# Patient Record
Sex: Female | Born: 1970 | Race: Black or African American | Hispanic: No | Smoking: Former smoker
Health system: Southern US, Community
[De-identification: ages and names within clinical notes are randomized; demographics above are authoritative.]

## PROBLEM LIST (undated history)

## (undated) DIAGNOSIS — C801 Malignant (primary) neoplasm, unspecified: Secondary | ICD-10-CM

## (undated) DIAGNOSIS — I10 Essential (primary) hypertension: Secondary | ICD-10-CM

## (undated) DIAGNOSIS — Z923 Personal history of irradiation: Secondary | ICD-10-CM

## (undated) HISTORY — PX: ABDOMINAL HYSTERECTOMY: SHX81

## (undated) HISTORY — PX: HERNIA REPAIR: SHX51

---

## 2005-09-11 ENCOUNTER — Emergency Department: Payer: Self-pay | Admitting: Emergency Medicine

## 2005-10-04 ENCOUNTER — Ambulatory Visit: Payer: Self-pay

## 2005-11-07 ENCOUNTER — Ambulatory Visit: Payer: Self-pay | Admitting: Oncology

## 2005-11-23 ENCOUNTER — Ambulatory Visit: Payer: Self-pay | Admitting: Oncology

## 2005-11-29 ENCOUNTER — Ambulatory Visit: Payer: Self-pay | Admitting: General Surgery

## 2006-01-08 ENCOUNTER — Ambulatory Visit: Payer: Self-pay | Admitting: General Surgery

## 2006-03-14 ENCOUNTER — Ambulatory Visit: Payer: Self-pay | Admitting: Oncology

## 2006-03-24 ENCOUNTER — Ambulatory Visit: Payer: Self-pay | Admitting: Oncology

## 2006-04-23 ENCOUNTER — Ambulatory Visit: Payer: Self-pay | Admitting: Oncology

## 2006-04-27 ENCOUNTER — Emergency Department: Payer: Self-pay | Admitting: Emergency Medicine

## 2006-04-27 ENCOUNTER — Other Ambulatory Visit: Payer: Self-pay

## 2006-05-24 ENCOUNTER — Ambulatory Visit: Payer: Self-pay | Admitting: Oncology

## 2006-07-03 ENCOUNTER — Ambulatory Visit: Payer: Self-pay | Admitting: Radiation Oncology

## 2007-01-03 ENCOUNTER — Ambulatory Visit: Payer: Self-pay | Admitting: Oncology

## 2007-07-22 IMAGING — US US PELV - US TRANSVAGINAL
1 series · 17 of 25 positions shown · non-contrast
Comparison: none

REASON FOR EXAM: Dysmenorrhea
COMMENTS:

[Series 1: us pelv - us transvaginal · 17 of 49 slices shown]
[im 1/49]
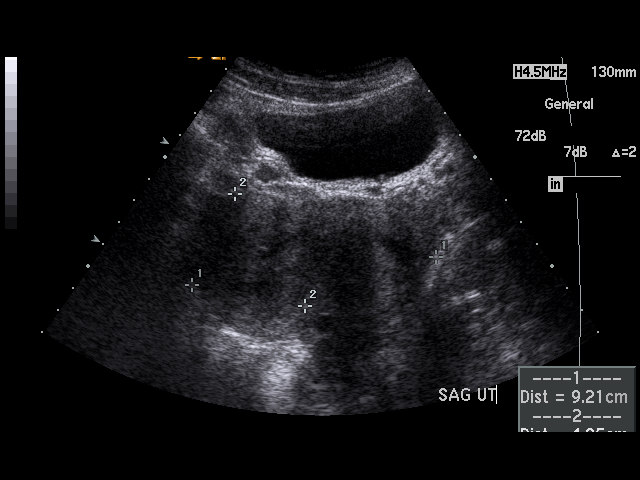
[im 5/49]
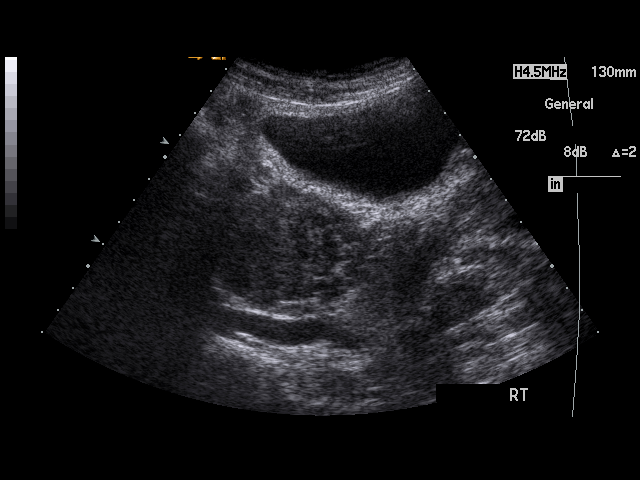
[im 7/49]
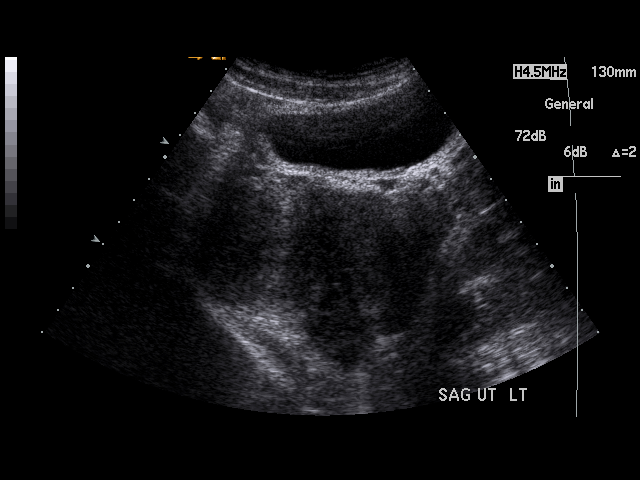
[im 11/49]
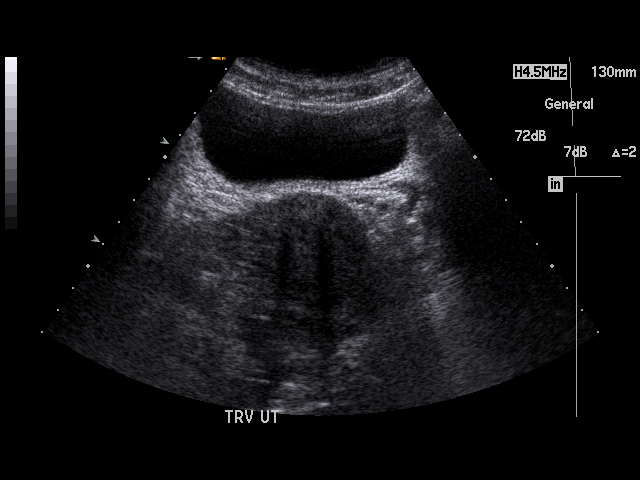
[im 13/49]
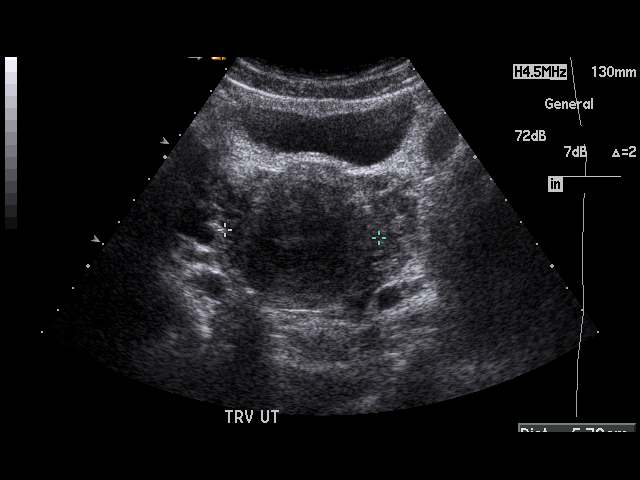
[im 17/49]
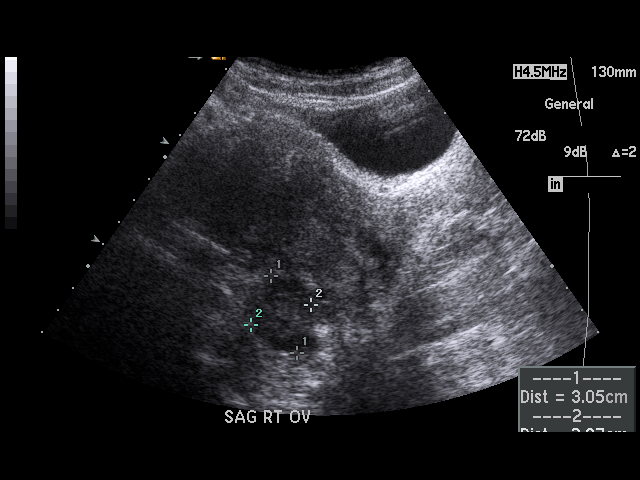
[im 19/49]
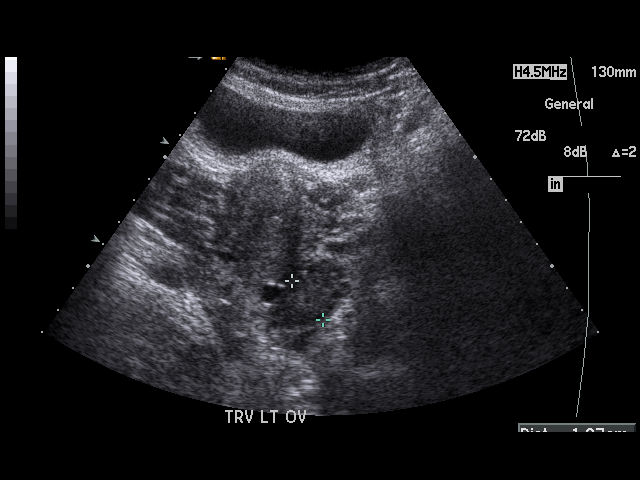
[im 23/49]
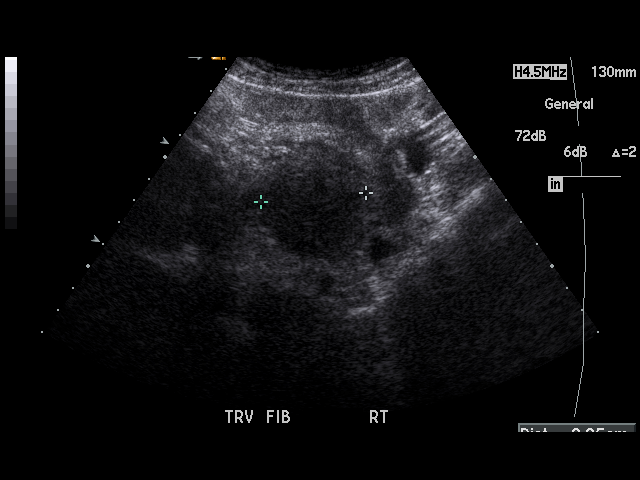
[im 25/49]
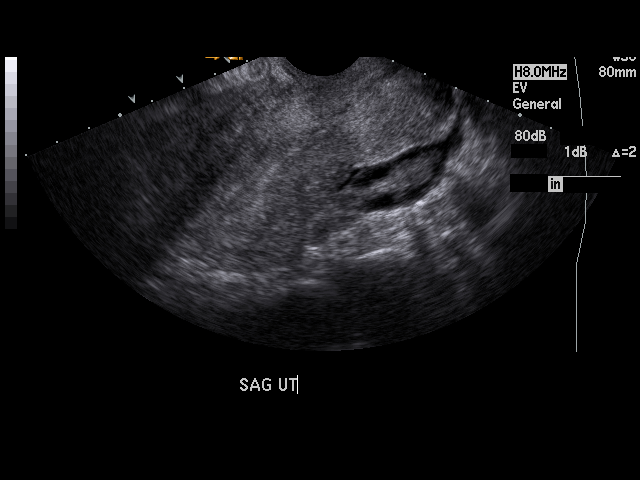
[im 27/49]
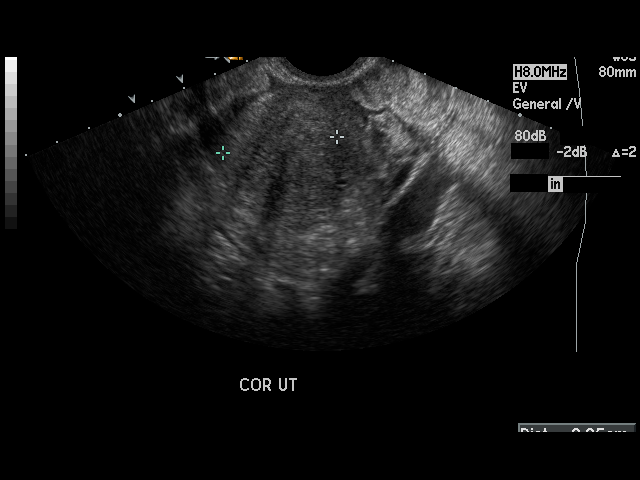
[im 31/49]
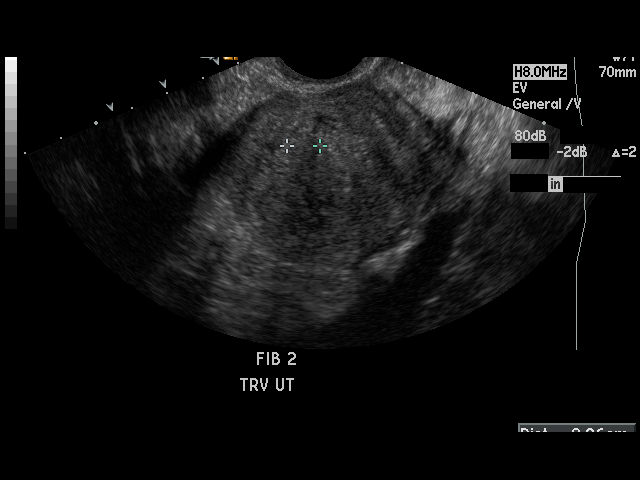
[im 33/49]
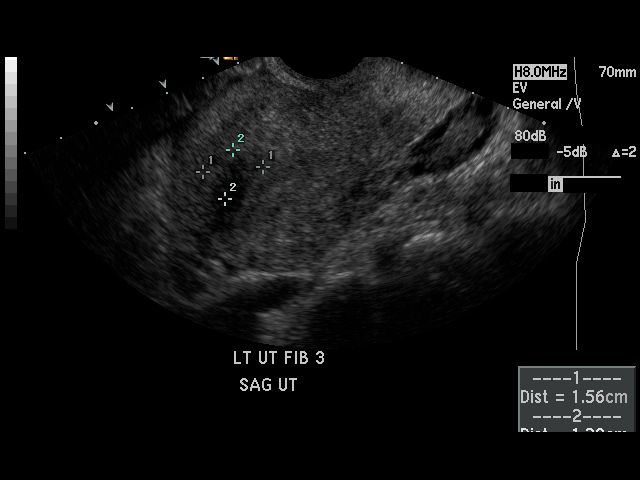
[im 37/49]
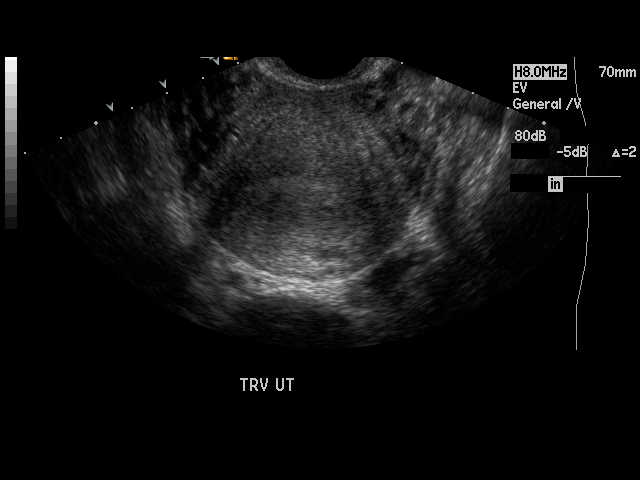
[im 39/49]
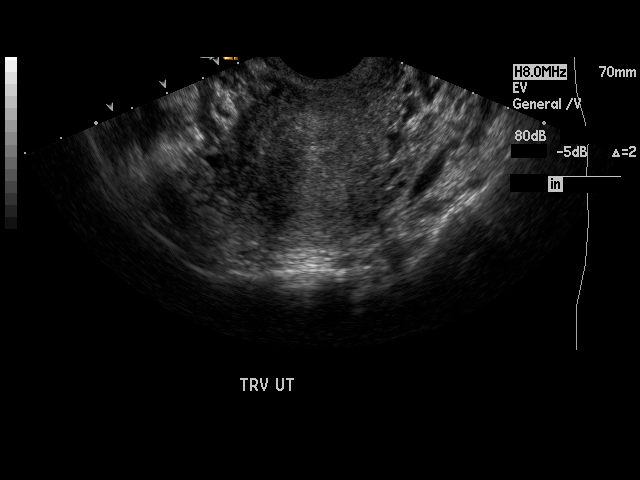
[im 43/49]
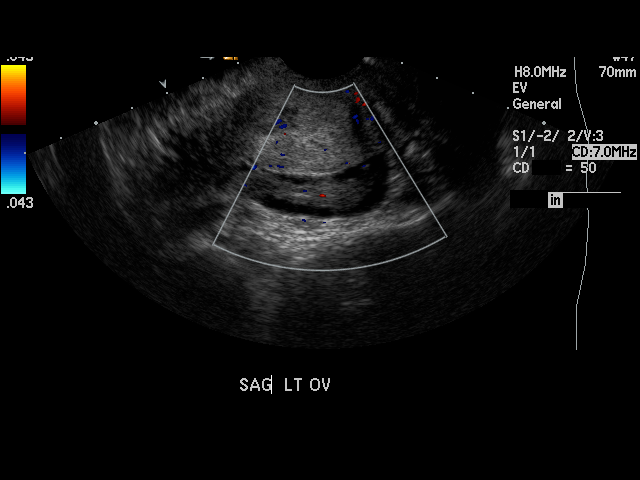
[im 45/49]
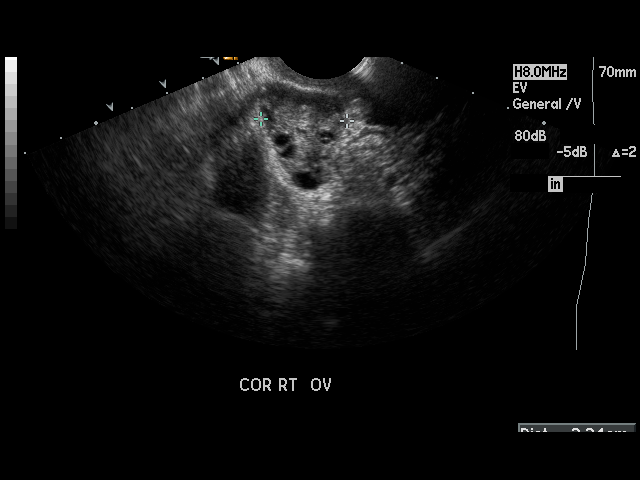
[im 49/49]
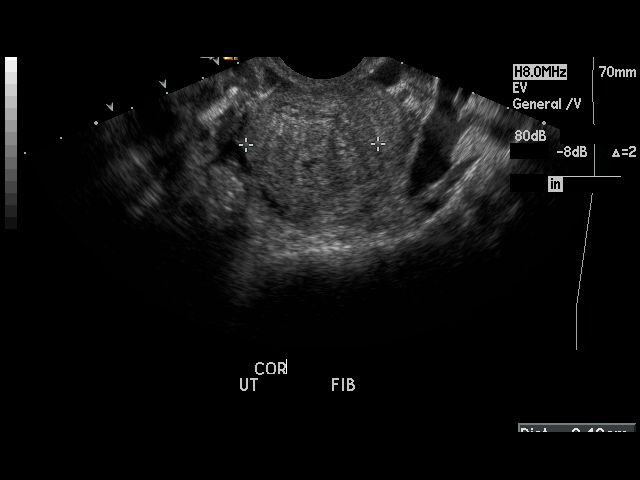

[17 of 25 positions shown; findings below may reference images not displayed]

PROCEDURE:     US  - US PELVIS MASS EXAM  - [DATE] [DATE] [DATE]  [DATE]

RESULT:     The uterus measures 9.21 cm longitudinally and 4.95 cm in AP
diameter.  The endometrium measures 6.2 mm in thickness.  There are noted
multiple hypoechoic masses in the uterus.  At least four are visualized.
The largest is in the uterine fundus and measures 4.24 cm at maximum
diameter.  At least three additional smaller hypoechoic areas compatible
with uterine fibroids are seen and measure variably between 1 cm and 1.56 cm
at maximum diameters. The RIGHT and LEFT ovaries are visualized. The RIGHT
ovary measures 2.9 cm at maximum diameter and the LEFT ovary measures 4.3 cm
at maximum diameter. A few follicular cysts are seen in each ovary. No
abnormal adnexal masses are seen. There is no free fluid in the pelvis. The
visualized portion of the urinary bladder shows no significant
abnormalities.
IMPRESSION: 1)The uterus is plump and multiple uterine fibroids are seen the largest
measures approximately 4.24 cm at maximum diameter.

2)No abnormal adnexal masses are seen.

3)There is no free fluid in the pelvis.

4)A few follicular cysts are noted in each ovary.

## 2008-03-24 ENCOUNTER — Ambulatory Visit: Payer: Self-pay | Admitting: Family Medicine

## 2008-03-26 ENCOUNTER — Ambulatory Visit: Payer: Self-pay | Admitting: Family Medicine

## 2009-02-24 ENCOUNTER — Emergency Department: Payer: Self-pay

## 2009-07-01 ENCOUNTER — Emergency Department: Payer: Self-pay | Admitting: Emergency Medicine

## 2011-01-04 ENCOUNTER — Inpatient Hospital Stay: Payer: Self-pay | Admitting: Obstetrics and Gynecology

## 2011-01-09 LAB — PATHOLOGY REPORT

## 2011-01-12 LAB — PATHOLOGY REPORT

## 2011-08-23 ENCOUNTER — Emergency Department: Payer: Self-pay | Admitting: Emergency Medicine

## 2012-04-05 ENCOUNTER — Emergency Department: Payer: Self-pay | Admitting: Emergency Medicine

## 2012-04-05 LAB — URINALYSIS, COMPLETE
Bilirubin,UR: NEGATIVE
Glucose,UR: NEGATIVE mg/dL (ref 0–75)
Nitrite: NEGATIVE
Ph: 5 (ref 4.5–8.0)
RBC,UR: 563 /HPF (ref 0–5)
Squamous Epithelial: 1
WBC UR: 123 /HPF (ref 0–5)

## 2012-04-05 LAB — WET PREP, GENITAL

## 2013-05-16 ENCOUNTER — Emergency Department: Payer: Self-pay | Admitting: Emergency Medicine

## 2013-09-21 ENCOUNTER — Emergency Department: Payer: Self-pay | Admitting: Emergency Medicine

## 2017-05-03 DIAGNOSIS — R350 Frequency of micturition: Secondary | ICD-10-CM | POA: Diagnosis not present

## 2017-05-03 DIAGNOSIS — Z7689 Persons encountering health services in other specified circumstances: Secondary | ICD-10-CM | POA: Diagnosis not present

## 2017-05-03 DIAGNOSIS — Z0000 Encounter for general adult medical examination without abnormal findings: Secondary | ICD-10-CM | POA: Diagnosis not present

## 2017-05-07 ENCOUNTER — Other Ambulatory Visit: Payer: Self-pay | Admitting: Nurse Practitioner

## 2017-05-07 DIAGNOSIS — Z803 Family history of malignant neoplasm of breast: Secondary | ICD-10-CM

## 2017-05-07 DIAGNOSIS — Z1231 Encounter for screening mammogram for malignant neoplasm of breast: Secondary | ICD-10-CM

## 2017-05-14 DIAGNOSIS — L720 Epidermal cyst: Secondary | ICD-10-CM | POA: Diagnosis not present

## 2017-05-14 DIAGNOSIS — D229 Melanocytic nevi, unspecified: Secondary | ICD-10-CM | POA: Diagnosis not present

## 2017-05-14 DIAGNOSIS — L905 Scar conditions and fibrosis of skin: Secondary | ICD-10-CM | POA: Diagnosis not present

## 2017-05-24 ENCOUNTER — Ambulatory Visit
Admission: RE | Admit: 2017-05-24 | Discharge: 2017-05-24 | Disposition: A | Discharge status: Home / Self Care | Payer: 59 | Source: Ambulatory Visit | Attending: Nurse Practitioner | Admitting: Nurse Practitioner

## 2017-05-24 ENCOUNTER — Encounter (HOSPITAL_COMMUNITY): Payer: Self-pay

## 2017-05-24 DIAGNOSIS — N6310 Unspecified lump in the right breast, unspecified quadrant: Secondary | ICD-10-CM | POA: Insufficient documentation

## 2017-05-24 DIAGNOSIS — Z803 Family history of malignant neoplasm of breast: Secondary | ICD-10-CM

## 2017-05-24 DIAGNOSIS — R928 Other abnormal and inconclusive findings on diagnostic imaging of breast: Secondary | ICD-10-CM | POA: Insufficient documentation

## 2017-05-24 DIAGNOSIS — Z1231 Encounter for screening mammogram for malignant neoplasm of breast: Secondary | ICD-10-CM

## 2017-05-24 HISTORY — DX: Malignant (primary) neoplasm, unspecified: C80.1

## 2017-05-24 HISTORY — DX: Personal history of irradiation: Z92.3

## 2017-05-29 ENCOUNTER — Other Ambulatory Visit: Payer: Self-pay | Admitting: Nurse Practitioner

## 2017-05-29 DIAGNOSIS — R928 Other abnormal and inconclusive findings on diagnostic imaging of breast: Secondary | ICD-10-CM

## 2017-05-29 DIAGNOSIS — N6310 Unspecified lump in the right breast, unspecified quadrant: Secondary | ICD-10-CM

## 2017-05-29 DIAGNOSIS — N6489 Other specified disorders of breast: Secondary | ICD-10-CM

## 2017-07-12 ENCOUNTER — Ambulatory Visit
Admission: RE | Admit: 2017-07-12 | Discharge: 2017-07-12 | Disposition: A | Discharge status: Home / Self Care | Payer: 59 | Source: Ambulatory Visit | Attending: Nurse Practitioner | Admitting: Nurse Practitioner

## 2017-07-12 DIAGNOSIS — R928 Other abnormal and inconclusive findings on diagnostic imaging of breast: Secondary | ICD-10-CM

## 2017-07-12 DIAGNOSIS — N6489 Other specified disorders of breast: Secondary | ICD-10-CM

## 2017-07-12 DIAGNOSIS — N6321 Unspecified lump in the left breast, upper outer quadrant: Secondary | ICD-10-CM | POA: Diagnosis not present

## 2017-07-12 DIAGNOSIS — N6310 Unspecified lump in the right breast, unspecified quadrant: Secondary | ICD-10-CM

## 2017-07-12 DIAGNOSIS — N6081 Other benign mammary dysplasias of right breast: Secondary | ICD-10-CM | POA: Diagnosis not present

## 2018-06-03 ENCOUNTER — Other Ambulatory Visit: Payer: Self-pay

## 2018-06-03 ENCOUNTER — Ambulatory Visit
Admission: EM | Admit: 2018-06-03 | Discharge: 2018-06-03 | Disposition: A | Discharge status: Home / Self Care | Payer: 59 | Attending: Family Medicine | Admitting: Family Medicine

## 2018-06-03 ENCOUNTER — Encounter: Payer: Self-pay | Admitting: Emergency Medicine

## 2018-06-03 DIAGNOSIS — I10 Essential (primary) hypertension: Secondary | ICD-10-CM

## 2018-06-03 DIAGNOSIS — R002 Palpitations: Secondary | ICD-10-CM

## 2018-06-03 LAB — BASIC METABOLIC PANEL
Anion gap: 10 (ref 5–15)
BUN: 10 mg/dL (ref 6–20)
CO2: 21 mmol/L — ABNORMAL LOW (ref 22–32)
Calcium: 8.9 mg/dL (ref 8.9–10.3)
Chloride: 108 mmol/L (ref 101–111)
Creatinine, Ser: 0.79 mg/dL (ref 0.44–1.00)
GFR calc Af Amer: >60 mL/min (ref >60)
GFR calc non Af Amer: >60 mL/min (ref >60)
Glucose, Bld: 85 mg/dL (ref 65–99)
Potassium: 3.7 mmol/L (ref 3.5–5.1)
Sodium: 139 mmol/L (ref 135–145)

## 2018-06-03 MED ORDER — HYDROCHLOROTHIAZIDE 12.5 MG PO TABS: 12.5000 mg | ORAL_TABLET | Freq: Every day | ORAL | 0 refills | Status: AC

## 2018-06-03 NOTE — ED Triage Notes (Signed)
Patient in today c/o a sharp pain in the top of her head. Patient started feeling bad and took her BP and it was 155/109. She took her BP this afternoon and it was 150/98. Patient does not have a history of HTN.

## 2018-06-03 NOTE — Discharge Instructions (Signed)
Take your pressure only 3 times a week at random times.  Develop a log with the date ,time ,blood pressure and pulse that you will provide to your primary care physician at your follow-up.  ReSearch  for potassium rich foods  you should take while on the medication.

## 2018-06-03 NOTE — ED Provider Notes (Signed)
[facility]    CSN: [id number] Arrival date & time: 06/03/18  1421     History   Chief Complaint Chief Complaint  Patient presents with  . Hypertension    HPI Nicole Davidson is a 47 y.o. female.   HPI  47 year old female presents with elevated blood pressure that she noticed a today after she started having pain in the top of her head yesterday at work.  No known history of hypertension other than during pregnancy.  Not know she has a family history of any significance.  Her aunt had a blood pressure cuff and she took blood pressures showing it to be 155/109 the afternoon of 150/98.  The afternoon she had blood pressures on admission of 161/117 and later with a manual cuff of 150/110.  Has been complaining of occasional palpitations but drinks lot of caffeine.     Past Medical History:  Diagnosis Date  . 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 Personal history of radiation therapy     There are no active problems to display for this patient.   Past Surgical History:  Procedure Laterality Date  . ABDOMINAL HYSTERECTOMY    . HERNIA REPAIR      OB History   None      Home Medications    Prior to Admission medications   Medication Sig Start Date End Date Taking? Authorizing Provider  hydrochlorothiazide (HYDRODIURIL) 12.5 MG tablet Take 1 tablet (12.5 mg total) by mouth daily. 06/03/18   Lorin Picket, PA-C    Family History Family History  Problem Relation Age of Onset  . Breast cancer Mother 31  . Other Father        unknown medical history    Social History Social History   Tobacco Use  . Smoking status: Former Smoker    Last attempt to quit: 06/03/2016    Years since quitting: 2.0  . Smokeless tobacco: Never Used  Substance Use Topics  . Alcohol use: Yes    Comment: socially  . Drug use: Never     Allergies   Penicillin g   Review of Systems Review of Systems  Constitutional: Negative for activity change, appetite  change, chills, fatigue and fever.  Cardiovascular: Positive for palpitations. Negative for chest pain.  Neurological: Positive for headaches.  All other systems reviewed and are negative.    Physical Exam Triage Vital Signs ED Triage Vitals  Enc Vitals Group     BP 06/03/18 1442 (!) 161/117     Pulse Rate 06/03/18 1442 97     Resp 06/03/18 1442 16     Temp 06/03/18 1442 98.2 F (36.8 C)     Temp Source 06/03/18 1442 Oral     SpO2 06/03/18 1442 99 %     Weight 06/03/18 1443 135 lb (61.2 kg)     Height 06/03/18 1443 5\' 7"  (1.702 m)     Head Circumference --      Peak Flow --      Pain Score 06/03/18 1442 6     Pain Loc --      Pain Edu? --      Excl. in [place]? --    No data found.  Updated Vital Signs BP (!) 154/114 (BP Location: Left Arm)   Pulse 97   Temp 98.2 F (36.8 C) (Oral)   Resp 16   Ht 5\' 7"  (1.702 m)   Wt 135 lb (61.2 kg)   SpO2 99%  BMI 21.14 kg/m   Visual Acuity Right Eye Distance:   Left Eye Distance:   Bilateral Distance:    Right Eye Near:   Left Eye Near:    Bilateral Near:     Physical Exam  Constitutional: She is oriented to person, place, and time. She appears well-developed and well-nourished. No distress.  HENT:  Head: Normocephalic.  Right Ear: External ear normal.  Left Ear: External ear normal.  Nose: Nose normal.  Mouth/Throat: Oropharynx is clear and moist. No oropharyngeal exudate.  Eyes: Pupils are equal, round, and reactive to light. Right eye exhibits no discharge. Left eye exhibits no discharge.  Neck: Normal range of motion.  Cardiovascular: Normal rate, regular rhythm and normal heart sounds.  Pulmonary/Chest: Effort normal and breath sounds normal.  Musculoskeletal: Normal range of motion.  Neurological: She is alert and oriented to person, place, and time.  Skin: Skin is warm and dry. She is not diaphoretic.  Psychiatric: She has a normal mood and affect. Her behavior is normal. Judgment and thought content normal.    Nursing note and vitals reviewed.    UC Treatments / Results  Labs (all labs ordered are listed, but only abnormal results are displayed) Labs Reviewed  BASIC METABOLIC PANEL - Abnormal; Notable for the following components:      Result Value   CO2 21 (*)    All other components within normal limits    EKG None  Radiology No results found.  Procedures Procedures (including critical care time)  Medications Ordered in UC Medications - No data to display  Initial Impression / Assessment and Plan / UC Course  I have reviewed the triage vital signs and the nursing notes.  Pertinent labs & imaging results that were available during my care of the patient were reviewed by me and considered in my medical decision making (see chart for details).     Plan: 1. Test/x-ray results and diagnosis reviewed with patient 2. rx as per orders; risks, benefits, potential side effects reviewed with patient 3. Recommend supportive treatment with monitoring salt in the diet.  Start her on HCTZ 12-1/2 mg daily.  BMP was normal.  Recommend she develop a blood pressure log that she will take only 3 times a week.  Present this to her primary care physician when she does a follow-up within the next month. 4. F/u prn if symptoms worsen or don't improve  Final Clinical Impressions(s) / UC Diagnoses   Final diagnoses:  Essential hypertension     Discharge Instructions     Take your pressure only 3 times a week at random times.  Develop a log with the date ,time ,blood pressure and pulse that you will provide to your primary care physician at your follow-up.  ReSearch  for potassium rich foods  you should take while on the medication.    ED Prescriptions    Medication Sig Dispense Auth. Provider   hydrochlorothiazide (HYDRODIURIL) 12.5 MG tablet Take 1 tablet (12.5 mg total) by mouth daily. 30 tablet Lorin Picket, PA-C     Controlled Substance Prescriptions [place] Controlled Substance  Registry consulted? Not Applicable   Lorin Picket, PA-C 06/03/18 1547

## 2018-06-06 ENCOUNTER — Other Ambulatory Visit: Payer: Self-pay

## 2018-06-06 ENCOUNTER — Encounter: Payer: Self-pay | Admitting: *Deleted

## 2018-06-06 ENCOUNTER — Emergency Department: Payer: Self-pay

## 2018-06-06 DIAGNOSIS — R51 Headache: Secondary | ICD-10-CM | POA: Insufficient documentation

## 2018-06-06 DIAGNOSIS — R110 Nausea: Secondary | ICD-10-CM | POA: Insufficient documentation

## 2018-06-06 DIAGNOSIS — I10 Essential (primary) hypertension: Secondary | ICD-10-CM | POA: Insufficient documentation

## 2018-06-06 DIAGNOSIS — R0602 Shortness of breath: Secondary | ICD-10-CM | POA: Insufficient documentation

## 2018-06-06 DIAGNOSIS — Z5321 Procedure and treatment not carried out due to patient leaving prior to being seen by health care provider: Secondary | ICD-10-CM | POA: Insufficient documentation

## 2018-06-06 LAB — BASIC METABOLIC PANEL
ANION GAP: 8 (ref 5–15)
BUN: 9 mg/dL (ref 6–20)
CALCIUM: 8.9 mg/dL (ref 8.9–10.3)
CHLORIDE: 106 mmol/L (ref 101–111)
CO2: 22 mmol/L (ref 22–32)
Creatinine, Ser: 0.81 mg/dL (ref 0.44–1.00)
GFR calc non Af Amer: >60 mL/min (ref >60)
Glucose, Bld: 94 mg/dL (ref 65–99)
Potassium: 3.4 mmol/L — ABNORMAL LOW (ref 3.5–5.1)
Sodium: 136 mmol/L (ref 135–145)

## 2018-06-06 LAB — CBC
HCT: 41.1 % (ref 35.0–47.0)
HEMOGLOBIN: 13.6 g/dL (ref 12.0–16.0)
MCH: 28.9 pg (ref 26.0–34.0)
MCHC: 33.2 g/dL (ref 32.0–36.0)
MCV: 87.2 fL (ref 80.0–100.0)
Platelets: 374 10*3/uL (ref 150–440)
RBC: 4.71 MIL/uL (ref 3.80–5.20)
RDW: 13.1 % (ref 11.5–14.5)
WBC: 6.4 10*3/uL (ref 3.6–11.0)

## 2018-06-06 LAB — TROPONIN I

## 2018-06-06 NOTE — ED Triage Notes (Signed)
Pt reports she has had a headache since waking up this morning, nausea, and her BP prior to coming to the ED was 160/100. Pt reports she was newly prescribed HCTZ on the 10th for HTN. She says her heart "races a lot" and has been having SOB since the 11th. Last tylenol 5pm today.

## 2018-06-07 ENCOUNTER — Emergency Department
Admission: EM | Admit: 2018-06-07 | Discharge: 2018-06-07 | Disposition: A | Discharge status: Home / Self Care | Payer: Self-pay | Attending: Emergency Medicine | Admitting: Emergency Medicine

## 2018-06-07 NOTE — ED Notes (Signed)
No answer when called from lobby x2. 

## 2018-06-07 NOTE — ED Notes (Signed)
No answer when called from lobby x3. 

## 2018-06-07 NOTE — ED Notes (Signed)
No answer when called from lobby x1 

## 2018-06-09 ENCOUNTER — Telehealth: Payer: Self-pay | Admitting: Emergency Medicine

## 2018-06-09 NOTE — Telephone Encounter (Signed)
Called patient due to lwot to inquire about condition and follow up plans. Left message.   

## 2020-09-26 ENCOUNTER — Other Ambulatory Visit: Payer: Self-pay | Admitting: Nurse Practitioner

## 2020-09-26 DIAGNOSIS — Z1231 Encounter for screening mammogram for malignant neoplasm of breast: Secondary | ICD-10-CM

## 2021-06-30 ENCOUNTER — Other Ambulatory Visit (HOSPITAL_COMMUNITY): Payer: Self-pay | Admitting: Orthopaedic Surgery

## 2021-06-30 ENCOUNTER — Other Ambulatory Visit: Payer: Self-pay | Admitting: Orthopaedic Surgery

## 2021-06-30 DIAGNOSIS — R52 Pain, unspecified: Secondary | ICD-10-CM

## 2021-07-04 ENCOUNTER — Ambulatory Visit
Admission: RE | Admit: 2021-07-04 | Discharge: 2021-07-04 | Disposition: A | Discharge status: Home / Self Care | Payer: 59 | Source: Ambulatory Visit | Attending: Orthopaedic Surgery | Admitting: Orthopaedic Surgery

## 2021-07-04 ENCOUNTER — Other Ambulatory Visit: Payer: Self-pay

## 2021-07-04 DIAGNOSIS — R52 Pain, unspecified: Secondary | ICD-10-CM | POA: Insufficient documentation

## 2021-07-31 ENCOUNTER — Ambulatory Visit (LOCAL_COMMUNITY_HEALTH_CENTER): Payer: Self-pay

## 2021-07-31 ENCOUNTER — Other Ambulatory Visit: Payer: Self-pay

## 2021-07-31 DIAGNOSIS — Z111 Encounter for screening for respiratory tuberculosis: Secondary | ICD-10-CM

## 2021-08-03 ENCOUNTER — Ambulatory Visit (LOCAL_COMMUNITY_HEALTH_CENTER): Payer: Self-pay | Admitting: Nurse Practitioner

## 2021-08-03 ENCOUNTER — Other Ambulatory Visit: Payer: Self-pay

## 2021-08-03 DIAGNOSIS — Z111 Encounter for screening for respiratory tuberculosis: Secondary | ICD-10-CM

## 2021-08-03 LAB — TB SKIN TEST
Induration: 0 mm
TB Skin Test: NEGATIVE

## 2021-08-03 NOTE — Progress Notes (Signed)
Patient in clinic for a PPD reading.  PPD reading at 79m. AGregary Cromer RN

## 2022-12-27 ENCOUNTER — Emergency Department: Payer: 59

## 2022-12-27 ENCOUNTER — Other Ambulatory Visit: Payer: Self-pay

## 2022-12-27 ENCOUNTER — Emergency Department
Admission: EM | Admit: 2022-12-27 | Discharge: 2022-12-27 | Disposition: A | Discharge status: Home / Self Care | Payer: 59 | Attending: Emergency Medicine | Admitting: Emergency Medicine

## 2022-12-27 ENCOUNTER — Encounter: Payer: Self-pay | Admitting: *Deleted

## 2022-12-27 DIAGNOSIS — S161XXA Strain of muscle, fascia and tendon at neck level, initial encounter: Secondary | ICD-10-CM | POA: Insufficient documentation

## 2022-12-27 DIAGNOSIS — Z79899 Other long term (current) drug therapy: Secondary | ICD-10-CM | POA: Diagnosis not present

## 2022-12-27 DIAGNOSIS — Y92410 Unspecified street and highway as the place of occurrence of the external cause: Secondary | ICD-10-CM | POA: Insufficient documentation

## 2022-12-27 DIAGNOSIS — S199XXA Unspecified injury of neck, initial encounter: Secondary | ICD-10-CM | POA: Diagnosis present

## 2022-12-27 MED ORDER — IBUPROFEN 600 MG PO TABS
600.0000 mg | ORAL_TABLET | Freq: Once | ORAL | Status: AC
  Administered 2022-12-27: 600 mg via ORAL
  Filled 2022-12-27: qty 1

## 2022-12-27 MED ORDER — MELOXICAM 15 MG PO TABS: 15.0000 mg | ORAL_TABLET | Freq: Every day | ORAL | 0 refills | Status: AC

## 2022-12-27 MED ORDER — CYCLOBENZAPRINE HCL 5 MG PO TABS: 5.0000 mg | ORAL_TABLET | Freq: Three times a day (TID) | ORAL | 0 refills | Status: AC | PRN

## 2022-12-27 MED ORDER — CYCLOBENZAPRINE HCL 10 MG PO TABS
5.0000 mg | ORAL_TABLET | Freq: Once | ORAL | Status: AC
  Administered 2022-12-27: 5 mg via ORAL
  Filled 2022-12-27: qty 1

## 2022-12-27 NOTE — Discharge Instructions (Signed)
Please take Tylenol and meloxicam as needed.  You may use Flexeril as needed for muscle spasms.  Perform gentle stretching exercises.  Follow-up with PCP or orthopedics in 1 week if no improvement.  Return to the ER for any worsening symptoms or any urgent changes in health
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
## 2023-06-17 ENCOUNTER — Encounter: Payer: Self-pay | Admitting: *Deleted

## 2023-06-24 ENCOUNTER — Encounter: Payer: Self-pay | Admitting: *Deleted

## 2023-06-24 ENCOUNTER — Encounter
Admission: RE | Disposition: A | Discharge status: Home / Self Care | Payer: Self-pay | Source: Home / Self Care | Attending: Gastroenterology

## 2023-06-24 ENCOUNTER — Ambulatory Visit
Admission: RE | Admit: 2023-06-24 | Discharge: 2023-06-24 | Disposition: A | Discharge status: Home / Self Care | Payer: 59 | Attending: Gastroenterology | Admitting: Gastroenterology

## 2023-06-24 ENCOUNTER — Ambulatory Visit: Payer: 59 | Admitting: Certified Registered"

## 2023-06-24 DIAGNOSIS — D125 Benign neoplasm of sigmoid colon: Secondary | ICD-10-CM | POA: Insufficient documentation

## 2023-06-24 DIAGNOSIS — Z923 Personal history of irradiation: Secondary | ICD-10-CM | POA: Insufficient documentation

## 2023-06-24 DIAGNOSIS — D128 Benign neoplasm of rectum: Secondary | ICD-10-CM | POA: Diagnosis not present

## 2023-06-24 DIAGNOSIS — Z1211 Encounter for screening for malignant neoplasm of colon: Secondary | ICD-10-CM | POA: Diagnosis present

## 2023-06-24 DIAGNOSIS — I10 Essential (primary) hypertension: Secondary | ICD-10-CM | POA: Diagnosis not present

## 2023-06-24 DIAGNOSIS — D124 Benign neoplasm of descending colon: Secondary | ICD-10-CM | POA: Diagnosis not present

## 2023-06-24 DIAGNOSIS — Z90710 Acquired absence of both cervix and uterus: Secondary | ICD-10-CM | POA: Insufficient documentation

## 2023-06-24 DIAGNOSIS — K640 First degree hemorrhoids: Secondary | ICD-10-CM | POA: Insufficient documentation

## 2023-06-24 HISTORY — PX: COLONOSCOPY WITH PROPOFOL: SHX5780

## 2023-06-24 HISTORY — PX: HEMOSTASIS CLIP PLACEMENT: SHX6857

## 2023-06-24 HISTORY — PX: POLYPECTOMY: SHX5525

## 2023-06-24 HISTORY — DX: Essential (primary) hypertension: I10

## 2023-06-24 SURGERY — COLONOSCOPY WITH PROPOFOL
Anesthesia: General

## 2023-06-24 MED ORDER — LIDOCAINE HCL (PF) 2 % IJ SOLN
INTRAMUSCULAR | Status: AC
  Filled 2023-06-24: qty 5

## 2023-06-24 MED ORDER — SODIUM CHLORIDE 0.9 % IV SOLN: INTRAVENOUS | Status: DC

## 2023-06-24 MED ORDER — LIDOCAINE HCL (PF) 2 % IJ SOLN
INTRAMUSCULAR | Status: DC | PRN
  Administered 2023-06-24: 40 mg via INTRADERMAL

## 2023-06-24 MED ORDER — PROPOFOL 1000 MG/100ML IV EMUL
INTRAVENOUS | Status: AC
  Filled 2023-06-24: qty 100

## 2023-06-24 MED ORDER — PROPOFOL 10 MG/ML IV BOLUS
INTRAVENOUS | Status: DC | PRN
  Administered 2023-06-24: 50 mg via INTRAVENOUS
  Administered 2023-06-24 (×10): 20 mg via INTRAVENOUS
  Administered 2023-06-24: 30 mg via INTRAVENOUS

## 2023-06-24 NOTE — Anesthesia Postprocedure Evaluation (Signed)
Anesthesia Post Note  Patient: Nicole Davidson  Procedure(s) Performed: COLONOSCOPY WITH PROPOFOL POLYPECTOMY HEMOSTASIS CLIP PLACEMENT  Patient location during evaluation: Endoscopy Anesthesia Type: General Level of consciousness: awake and alert Pain management: pain level controlled Vital Signs Assessment: post-procedure vital signs reviewed and stable Respiratory status: spontaneous breathing, nonlabored ventilation, respiratory function stable and patient connected to nasal cannula oxygen Cardiovascular status: blood pressure returned to baseline and stable Postop Assessment: no apparent nausea or vomiting Anesthetic complications: no   No notable events documented.   Last Vitals:  Vitals:   06/24/23 0830 06/24/23 0839  BP: 123/82 (!) 147/115  Pulse: (!) 56 (!) 58  Resp: 18 18  Temp:    SpO2: 98% 100%    Last Pain:  Vitals:   06/24/23 0830  TempSrc:   PainSc: 0-No pain                 Louie Boston

## 2023-06-24 NOTE — Anesthesia Preprocedure Evaluation (Addendum)
Anesthesia Evaluation  Patient identified by MRN, date of birth, ID band Patient awake    Reviewed: Allergy & Precautions, NPO status , Patient's Chart, lab work & pertinent test results  History of Anesthesia Complications Negative for: history of anesthetic complications  Airway Mallampati: II  TM Distance: >3 FB Neck ROM: full    Dental no notable dental hx.    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 No date: Hypertension No date: Personal history of radiation therapy  Past Surgical History: No date: ABDOMINAL HYSTERECTOMY No date: HERNIA REPAIR  BMI    Body Mass Index: 24.50 kg/m      Reproductive/Obstetrics negative OB ROS                              Anesthesia Physical Anesthesia Plan  ASA: 2  Anesthesia Plan: General   Post-op Pain Management: Minimal or no pain anticipated   Induction: Intravenous  PONV Risk Score and Plan: Propofol infusion and TIVA  Airway Management Planned: Natural Airway and Nasal Cannula  Additional Equipment:   Intra-op Plan:   Post-operative Plan:   Informed Consent: I have reviewed the patients History and Physical, chart, labs and discussed the procedure including the risks, benefits and alternatives for the proposed anesthesia with the patient or authorized representative who has indicated his/her understanding and acceptance.     Dental Advisory Given  Plan  Discussed with: Anesthesiologist, CRNA and Surgeon  Anesthesia Plan Comments: (Patient consented for risks of anesthesia including but not limited to:  - adverse reactions to medications - risk of airway placement if required - damage to eyes, teeth, lips or other oral mucosa - nerve damage due to positioning  - sore throat or hoarseness - Damage to heart, brain, nerves, lungs, other parts of body or loss of life  Patient voiced understanding.)        Anesthesia Quick Evaluation

## 2023-06-24 NOTE — H&P (Signed)
Outpatient short stay form Pre-procedure 06/24/2023  Regis Bill, MD  Primary Physician: Myrene Buddy, NP  Reason for visit:  Screening colonoscopy  History of present illness:    52 y/o lady with history of hypertension here for index screening colonoscopy. History of hysterectomy. No blood thinners. No family history of GI malignancies.    Current Facility-Administered Medications:    0.9 %  sodium chloride infusion, , Intravenous, Continuous, Elvert Cumpton, Rossie Muskrat, MD, Last Rate: 20 mL/hr at 06/24/23 1610, Continued from Pre-op at 06/24/23 0721  Medications Prior to Admission  Medication Sig Dispense Refill Last Dose   cyclobenzaprine (FLEXERIL) 5 MG tablet Take 1-2 tablets (5-10 mg total) by mouth 3 (three) times daily as needed for muscle spasms. (Patient not taking: Reported on 06/17/2023) 20 tablet 0 Not Taking   hydrochlorothiazide (HYDRODIURIL) 12.5 MG tablet Take 1 tablet (12.5 mg total) by mouth daily. (Patient not taking: Reported on 06/17/2023) 30 tablet 0 Not Taking   meloxicam (MOBIC) 15 MG tablet Take 1 tablet (15 mg total) by mouth daily. 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    Physical Exam  Gen: Alert, oriented. Appears stated age.  HEENT: PERRLA. Lungs: No respiratory distress CV: RRR Abd: soft, benign, no masses Ext: No edema    Planned procedures: Proceed with colonoscopy. The patient understands the nature of the planned procedure, indications, risks, alternatives and potential complications including but not limited to bleeding, infection, perforation, damage to internal organs and possible oversedation/side effects from anesthesia. The patient agrees  and gives consent to proceed.  Please refer to procedure notes for findings, recommendations and patient disposition/instructions.     Regis Bill, MD [facility]

## 2023-06-24 NOTE — Transfer of Care (Signed)
Immediate Anesthesia Transfer of Care Note  Patient: Nicole Davidson  Procedure(s) Performed: COLONOSCOPY WITH PROPOFOL POLYPECTOMY HEMOSTASIS CLIP PLACEMENT  Patient Location: PACU and Endoscopy Unit  Anesthesia Type:MAC  Level of Consciousness: awake  Airway & Oxygen Therapy: Patient Spontanous Breathing and Patient connected to nasal cannula oxygen  Post-op Assessment: Report given to RN and Post -op Vital signs reviewed and stable  Post vital signs: Reviewed and stable  Last Vitals:  Vitals Value Taken Time  BP 111/75 06/24/23 0816  Temp 35.5 C 06/24/23 0814  Pulse 64 06/24/23 0816  Resp 18 06/24/23 0816  SpO2 100 % 06/24/23 0816  Vitals shown include unvalidated device data.  Last Pain:  Vitals:   06/24/23 0814  TempSrc: Temporal  PainSc: 0-No pain         Complications: No notable events documented.

## 2023-06-24 NOTE — Interval H&P Note (Signed)
History and Physical Interval Note:  06/24/2023 7:42 AM  Nicole Davidson  has presented today for surgery, with the diagnosis of colon cancer screening.  The various methods of treatment have been discussed with the patient and family. After consideration of risks, benefits and other options for treatment, the patient has consented to  Procedure(s): COLONOSCOPY WITH PROPOFOL (N/A) as a surgical intervention.  The patient's history has been reviewed, patient examined, no change in status, stable for surgery.  I have reviewed the patient's chart and labs.  Questions were answered to the patient's satisfaction.     Regis Bill  Ok to proceed with colonoscopy

## 2023-06-24 NOTE — Op Note (Signed)
[facility] Gastroenterology Patient Name: Nicole Davidson Procedure Date: 06/24/2023 7:00 AM MRN: [id number] Account #: [id number] Date of Birth: 09-14-1971 Admit Type: Outpatient Age: 52 Room: [facility] ENDO ROOM 3 Gender: Female Note Status: Finalized Instrument Name: Prentice Docker [id number] Procedure:             Colonoscopy Indications:           Screening for colorectal malignant neoplasm Providers:             Eather Colas MD, MD Referring MD:          Caryl Asp (Referring MD) Medicines:             Monitored Anesthesia Care Complications:         No immediate complications. Estimated blood loss:                         Minimal. Procedure:             Pre-Anesthesia Assessment:                        - Prior to the procedure, a History and Physical was                         performed, and patient medications and allergies were                         reviewed. The patient is competent. The risks and                         benefits of the procedure and the sedation options and                         risks were discussed with the patient. All questions                         were answered and informed consent was obtained.                         Patient identification and proposed procedure were                         verified by the physician, the nurse, the                         anesthesiologist, the anesthetist and the technician                         in the endoscopy suite. Mental Status Examination:                         alert and oriented. Airway Examination: normal                         oropharyngeal airway and neck mobility. Respiratory                         Examination: clear to auscultation. CV Examination:  normal. Prophylactic Antibiotics: The patient does not                         require prophylactic antibiotics. Prior                         Anticoagulants: The patient has taken no anticoagulant                          or antiplatelet agents. ASA Grade Assessment: II - A                         patient with mild systemic disease. After reviewing                         the risks and benefits, the patient was deemed in                         satisfactory condition to undergo the procedure. The                         anesthesia plan was to use monitored anesthesia care                         (MAC). Immediately prior to administration of                         medications, the patient was re-assessed for adequacy                         to receive sedatives. The heart rate, respiratory                         rate, oxygen saturations, blood pressure, adequacy of                         pulmonary ventilation, and response to care were                         monitored throughout the procedure. The physical                         status of the patient was re-assessed after the                         procedure.                        After obtaining informed consent, the colonoscope was                         passed under direct vision. Throughout the procedure,                         the patient's blood pressure, pulse, and oxygen                         saturations were monitored continuously. The  Colonoscope was introduced through the anus and                         advanced to the the terminal ileum. The colonoscopy                         was performed without difficulty. The patient                         tolerated the procedure well. The quality of the bowel                         preparation was good. The terminal ileum, ileocecal                         valve, appendiceal orifice, and rectum were                         photographed. Findings:      The perianal and digital rectal examinations were normal.      The terminal ileum appeared normal.      Two sessile polyps were found in the descending colon. The polyps were 2       to 3 mm in size. These  polyps were removed with a cold snare. Resection       and retrieval were complete. Estimated blood loss was minimal.      A 1 mm polyp was found in the sigmoid colon. The polyp was hyperplastic.       The polyp was removed with a jumbo cold forceps. Resection and retrieval       were complete. Estimated blood loss was minimal.      A 10 mm polyp was found in the rectum. The polyp was semi-pedunculated.       The polyp was removed with a hot snare. Resection and retrieval were       complete. To prevent bleeding post-intervention, one hemostatic clip was       successfully placed. There was no bleeding during, or at the end, of the       procedure.      Internal hemorrhoids were found during retroflexion. The hemorrhoids       were Grade I (internal hemorrhoids that do not prolapse).      The exam was otherwise without abnormality on direct and retroflexion       views. Impression:            - The examined portion of the ileum was normal.                        - Two 2 to 3 mm polyps in the descending colon,                         removed with a cold snare. Resected and retrieved.                        - One 1 mm polyp in the sigmoid colon, removed with a                         jumbo cold forceps. Resected and retrieved.                        -  One 10 mm polyp in the rectum, removed with a hot                         snare. Resected and retrieved. Clip was placed.                        - Internal hemorrhoids.                        - The examination was otherwise normal on direct and                         retroflexion views. Recommendation:        - Discharge patient to home.                        - Resume previous diet.                        - Continue present medications.                        - Await pathology results.                        - Repeat colonoscopy in 3 years for surveillance.                        - Return to referring physician as previously                          scheduled. Procedure Code(s):     --- Professional ---                        [id number], Colonoscopy, flexible; with removal of                         tumor(s), polyp(s), or other lesion(s) by snare                         technique                        45380, 59, Colonoscopy, flexible; with biopsy, single                         or multiple Diagnosis Code(s):     --- Professional ---                        Z12.11, Encounter for screening for malignant neoplasm                         of colon                        K64.0, First degree hemorrhoids                        D12.4, Benign neoplasm of descending colon  D12.5, Benign neoplasm of sigmoid colon                        D12.8, Benign neoplasm of rectum CPT copyright 2022 [employer]. All rights reserved. The codes documented in this report are preliminary and upon coder review may  be revised to meet current compliance requirements. Eather Colas MD, MD 06/24/2023 8:19:26 AM Number of Addenda: 0 Note Initiated On: 06/24/2023 7:00 AM Scope Withdrawal Time: 0 hours 15 minutes 19 seconds  Total Procedure Duration: 0 hours 21 minutes 33 seconds  Estimated Blood Loss:  Estimated blood loss was minimal.      [facility]

## 2023-06-25 ENCOUNTER — Encounter: Payer: Self-pay | Admitting: Gastroenterology
